# Patient Record
Sex: Male | Born: 1995 | Race: White | Hispanic: No | Marital: Single | Smoking: Never smoker
Health system: Southern US, Community
[De-identification: ages and names within clinical notes are randomized; demographics above are authoritative.]

## PROBLEM LIST (undated history)

## (undated) DIAGNOSIS — M419 Scoliosis, unspecified: Secondary | ICD-10-CM

## (undated) DIAGNOSIS — K900 Celiac disease: Secondary | ICD-10-CM

## (undated) HISTORY — DX: Celiac disease: K90.0

## (undated) HISTORY — DX: Scoliosis, unspecified: M41.9

---
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
## 2015-09-16 ENCOUNTER — Telehealth: Payer: Self-pay | Admitting: Medical

## 2015-09-16 NOTE — Telephone Encounter (Signed)
Pt's mother dropped off immun records and a form for completion. I am [place] to [place] to be [place] then to sh[place] for signature. Please abstract into system and national registry when complete. From needs to be mailed in supplied envelope and mother called stated its been done. Pt needs by 09/28/2015 for orientation.

## 2015-09-17 NOTE — Telephone Encounter (Signed)
Given to shane to sign then will be mailed by the end of the day. Have called pts mom to let her know.

## 2020-07-02 ENCOUNTER — Encounter (HOSPITAL_BASED_OUTPATIENT_CLINIC_OR_DEPARTMENT_OTHER): Payer: Self-pay

## 2020-07-02 ENCOUNTER — Emergency Department (HOSPITAL_BASED_OUTPATIENT_CLINIC_OR_DEPARTMENT_OTHER): Payer: BC Managed Care – PPO

## 2020-07-02 ENCOUNTER — Other Ambulatory Visit: Payer: Self-pay

## 2020-07-02 ENCOUNTER — Emergency Department (HOSPITAL_BASED_OUTPATIENT_CLINIC_OR_DEPARTMENT_OTHER)
Admission: EM | Admit: 2020-07-02 | Discharge: 2020-07-02 | Disposition: A | Discharge status: Home / Self Care | Payer: BC Managed Care – PPO | Attending: Emergency Medicine | Admitting: Emergency Medicine

## 2020-07-02 DIAGNOSIS — R569 Unspecified convulsions: Secondary | ICD-10-CM | POA: Diagnosis not present

## 2020-07-02 LAB — COMPREHENSIVE METABOLIC PANEL
ALT: 46 U/L — ABNORMAL HIGH (ref 0–44)
AST: 88 U/L — ABNORMAL HIGH (ref 15–41)
Albumin: 4.7 g/dL (ref 3.5–5.0)
Alkaline Phosphatase: 117 U/L (ref 38–126)
Anion gap: 13 (ref 5–15)
BUN: 10 mg/dL (ref 6–20)
CO2: 24 mmol/L (ref 22–32)
Calcium: 9.6 mg/dL (ref 8.9–10.3)
Chloride: 98 mmol/L (ref 98–111)
Creatinine, Ser: 1.06 mg/dL (ref 0.61–1.24)
GFR, Estimated: >60 mL/min (ref >60)
Glucose, Bld: 111 mg/dL — ABNORMAL HIGH (ref 70–99)
Potassium: 4.3 mmol/L (ref 3.5–5.1)
Sodium: 135 mmol/L (ref 135–145)
Total Bilirubin: 1.1 mg/dL (ref 0.3–1.2)
Total Protein: 8.6 g/dL — ABNORMAL HIGH (ref 6.5–8.1)

## 2020-07-02 LAB — CBC
HCT: 43.6 % (ref 39.0–52.0)
Hemoglobin: 15.2 g/dL (ref 13.0–17.0)
MCH: 33 pg (ref 26.0–34.0)
MCHC: 34.9 g/dL (ref 30.0–36.0)
MCV: 94.8 fL (ref 80.0–100.0)
Platelets: 261 10*3/uL (ref 150–400)
RBC: 4.6 MIL/uL (ref 4.22–5.81)
RDW: 12.8 % (ref 11.5–15.5)
WBC: 10 10*3/uL (ref 4.0–10.5)
nRBC: 0 % (ref 0.0–0.2)

## 2020-07-02 LAB — URINALYSIS, ROUTINE W REFLEX MICROSCOPIC
Bilirubin Urine: NEGATIVE
Glucose, UA: NEGATIVE mg/dL
Hgb urine dipstick: NEGATIVE
Ketones, ur: NEGATIVE mg/dL
Leukocytes,Ua: NEGATIVE
Nitrite: NEGATIVE
Protein, ur: NEGATIVE mg/dL
Specific Gravity, Urine: 1.01 (ref 1.005–1.030)
pH: 8 (ref 5.0–8.0)

## 2020-07-02 LAB — RAPID URINE DRUG SCREEN, HOSP PERFORMED
Amphetamines: NOT DETECTED
Barbiturates: NOT DETECTED
Benzodiazepines: NOT DETECTED
Cocaine: NOT DETECTED
Opiates: NOT DETECTED
Tetrahydrocannabinol: NOT DETECTED

## 2020-07-02 LAB — ETHANOL: Alcohol, Ethyl (B): <10 mg/dL (ref <10)

## 2020-07-02 NOTE — ED Provider Notes (Signed)
[facility] EMERGENCY DEPARTMENT Provider Note   CSN: [id number] Arrival date & time: 07/02/20  1132     History Chief Complaint  Patient presents with  . Seizures    Manuel Simmons is a 24 y.o. male.   Seizures Seizure activity on arrival: no   Seizure type:  Myoclonic and tonic Preceding symptoms: no sensation of an aura present, no headache, no hyperventilation, no nausea and no panic   Initial focality:  None Episode characteristics: abnormal movements, generalized shaking and stiffening   Episode characteristics: no eye deviation   Postictal symptoms: no confusion, no memory loss and no somnolence   Return to baseline: yes   Severity:  Moderate Timing:  Once Progression:  Resolved Recent head injury:  No recent head injuries PTA treatment:  None History of seizures: no        Past Medical History:  Diagnosis Date  . Celiac disease   . Scoliosis     Patient Active Problem List   Diagnosis Date Noted  . Knee pain, right anterior 05/22/2013    History reviewed. No pertinent surgical history.     Family History  Problem Relation Age of Onset  . Cancer Neg Hx   . Diabetes Neg Hx   . Heart disease Neg Hx   . Stroke Neg Hx   . Hypertension Neg Hx     Social History   Tobacco Use  . Smoking status: Never Smoker  . Smokeless tobacco: Never Used  Vaping Use  . Vaping Use: Never used  Substance Use Topics  . Alcohol use: Yes    Alcohol/week: 6.0 standard drinks    Types: 6 Cans of beer per week    Comment: daily  . Drug use: No    Home Medications Prior to Admission medications   Not on File    Allergies    Wheat bran  Review of Systems   Review of Systems  Constitutional: Negative for chills and fever.  HENT: Negative for congestion and rhinorrhea.   Respiratory: Negative for cough and shortness of breath.   Cardiovascular: Negative for chest pain and palpitations.  Gastrointestinal: Negative for diarrhea, nausea and  vomiting.  Genitourinary: Negative for difficulty urinating and dysuria.  Musculoskeletal: Negative for arthralgias and back pain.  Skin: Negative for color change and rash.  Neurological: Positive for seizures. Negative for light-headedness and headaches.    Physical Exam Updated Vital Signs BP 134/77 (BP Location: Right Arm)   Pulse 81   Temp 98.3 F (36.8 C) (Oral)   Resp 18   Ht 5\' 7"  (1.702 m)   Wt 64.4 kg   SpO2 100%   BMI 22.24 kg/m   Physical Exam Vitals and nursing note reviewed.  Constitutional:      General: He is not in acute distress.    Appearance: Normal appearance.  HENT:     Head: Normocephalic and atraumatic.     Nose: No rhinorrhea.  Eyes:     General:        Right eye: No discharge.        Left eye: No discharge.     Conjunctiva/sclera: Conjunctivae normal.  Cardiovascular:     Rate and Rhythm: Normal rate and regular rhythm.  Pulmonary:     Effort: Pulmonary effort is normal.     Breath sounds: No stridor.  Abdominal:     General: Abdomen is flat. There is no distension.     Palpations: Abdomen is soft.  Musculoskeletal:  General: No deformity or signs of injury.  Skin:    General: Skin is warm and dry.  Neurological:     General: No focal deficit present.     Mental Status: He is alert. Mental status is at baseline.     Motor: No weakness.     Comments: 5 out of 5 motor strength in all extremities, sensation intact throughout, no dysmetria, no dysdiadochokinesia, no ataxia with ambulation, cranial nerves II through XII intact, alert and oriented to person place and time   Psychiatric:        Mood and Affect: Mood normal.        Behavior: Behavior normal.        Thought Content: Thought content normal.     ED Results / Procedures / Treatments   Labs (all labs ordered are listed, but only abnormal results are displayed) Labs Reviewed  COMPREHENSIVE METABOLIC PANEL - Abnormal; Notable for the following components:      Result  Value   Glucose, Bld 111 (*)    Total Protein 8.6 (*)    AST 88 (*)    ALT 46 (*)    All other components within normal limits  RAPID URINE DRUG SCREEN, HOSP PERFORMED  ETHANOL  URINALYSIS, ROUTINE W REFLEX MICROSCOPIC  CBC    EKG None  Radiology CT Head Wo Contrast  Result Date: 07/02/2020 CLINICAL DATA:  Seizure, nontraumatic. Additional history provided: New seizure-like activity at job site. Patient denies head trauma, patient denies history of seizures. EXAM: CT HEAD WITHOUT CONTRAST TECHNIQUE: Contiguous axial images were obtained from the base of the skull through the vertex without intravenous contrast. COMPARISON:  No pertinent prior exams are available for comparison. FINDINGS: Brain: Cerebral volume is normal. There is no acute intracranial hemorrhage. No demarcated cortical infarct. No extra-axial fluid collection. No evidence of intracranial mass. No midline shift. Vascular: No hyperdense vessel. Skull: Normal. Negative for fracture or focal lesion. Sinuses/Orbits: Visualized orbits show no acute finding. No significant paranasal sinus disease or mastoid effusion at the imaged levels. IMPRESSION: No evidence of acute intracranial abnormality. Given the provided history of first-time seizure, consider brain MRI for further evaluation. Electronically Signed   By: Jackey Loge DO   On: 07/02/2020 12:29    Procedures Procedures (including critical care time)  Medications Ordered in ED Medications - No data to display  ED Course  I have reviewed the triage vital signs and the nursing notes.  Pertinent labs & imaging results that were available during my care of the patient were reviewed by me and considered in my medical decision making (see chart for details).    MDM Rules/Calculators/A&P                          New onset generalized tonic-clonic seizure lasted only a few minutes self resolved, patient cannot think of any instigating event, denies drug use, occasional  alcohol use reported.  Normal neurologic exam here.  No signs of trauma no recent history of trauma.  No recent illicit substance no recent change in medications, patient has no medical problems no family history.  CT scan labs reassess.  CT scan reviewed by radiology myself shows no significant abnormalities.  Laboratory studies are unremarkable for any derangement after my review.  No intoxication noted.  Patient is given return precautions instructions to follow-up with neurology.  Final Clinical Impression(s) / ED Diagnoses Final diagnoses:  Seizure (HCC)    Rx /  DC Orders ED Discharge Orders    None       Sabino Donovan, MD 07/02/20 1328

## 2020-07-02 NOTE — Discharge Instructions (Addendum)
Follow-up with a neurologist, you can call today and set up an appointment.  Return with worsening symptoms, avoid caffeine, avoid operating heavy machinery until cleared by the neurologist

## 2020-07-02 NOTE — ED Notes (Signed)
Taken to ct at this time. 

## 2020-07-02 NOTE — ED Triage Notes (Addendum)
Pt states he had a seizure at work ~8am-no hx of seizure-mother states she was called ~10am and went to get pt and bring him to ED-pt NAD/shaky/tremors-reports daily ETOH with last intake ~10pm yesterday-steady gait

## 2020-07-09 ENCOUNTER — Ambulatory Visit (INDEPENDENT_AMBULATORY_CARE_PROVIDER_SITE_OTHER): Payer: BC Managed Care – PPO | Admitting: Neurology

## 2020-07-09 ENCOUNTER — Encounter: Payer: Self-pay | Admitting: Neurology

## 2020-07-09 VITALS — BP 135/73 | HR 75 | Ht 67.0 in | Wt 146.0 lb

## 2020-07-09 DIAGNOSIS — R569 Unspecified convulsions: Secondary | ICD-10-CM

## 2020-07-09 NOTE — Progress Notes (Signed)
Provider:  Melvyn Novas, M D  Referring Provider: No ref. provider found Primary Care Physician:  Patient, No Pcp Per  Chief Complaint  Patient presents with  . New Patient (Initial Visit)    pt with dad (mom in waiting area), rm 10. presents today post ER visit. had a one time seizure like occurance while he was at work. states that he was working the next thing he knew he came to and was laying on floor. the co worker witnessed event and stated that he was foaming at the mouth. advised that it lasted around 30 seconds, his body was tensed up and he was unreponsive. patient has no recollection of the event.     HPI:  Manuel Simmons is a 24 y.o. male  and seen here upon  a referral from [facility]-  Mr. Doman reports that he apparently suffered a seizure in the morning hours of 07-02-20 the arrival time at [facility] was locked in at 1130.  Seizure may have occurred within an hour or 2 prior.  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  A coworker stated that he was out for about 30 seconds, foaming at the mouth, with not being responsive.  There is no more detailed report available apparently however there were no injuries related to that spell he did bite his tongue, but had no abrasions and no incontinence.  Coworkers later reported that he may have not felt well that morning and may have had a lot of coffee to drink however there was no history of fever febrile illness or vomiting.  There was never a seizure before.  The patient is not under the impression that he was sleep deprived.  He has to arise early as his work begins at 7:30 AM and his seizure would have taken place a couple of hours into work. The company's owner called Connor's parents and they picked him up as he had already regained consciousness and brought him to the med center.  EMS was not involved.  The patient has celiac disease.   Social history   Lives alone, no pets, but is close with parents, works in [employer] representative, On smoker, beer drinker, caffeine _ 4 cups I AM, no sodas no tea.  Sports : none .   Review of Systems: Out of a complete 14 system review, the patient complains of only the following symptoms, and all other reviewed systems are negative.  No medication or drug use. No learning disabiities, no ADD/ ADHD.   Social History   Socioeconomic History  . Marital status: Single    Spouse name: Not on file  . Number of children: Not on file  . Years of education: Not on file  . Highest education level: Not on file  Occupational History  . Not on file  Tobacco Use  . Smoking status: Never Smoker  . Smokeless tobacco: Never Used  Vaping Use  . Vaping Use: Never used  Substance and Sexual Activity  . Alcohol use: Yes    Alcohol/week: 6.0 standard drinks    Types: 6 Cans of beer per week    Comment: daily  . Drug use: No  . Sexual activity: Not on file  Other Topics Concern  . Not on file  Social History Narrative   Senior in high school at [employer], plays tennis and cross-country.  Planning to attend [employer].  Exercises, eats healthy .  Father is an Airline pilot, mother is a Futures trader.  He has 2 brothers.  AB grades   Social Determinants of Health   Financial Resource Strain:   . Difficulty of Paying Living Expenses: Not on file  Food Insecurity:   . Worried About Programme researcher, broadcasting/film/video in the Last Year: Not on file  . Ran Out of Food in the Last Year: Not on file  Transportation Needs:   . Lack of Transportation (Medical): Not on file  . Lack of Transportation (Non-Medical): Not on file  Physical Activity:   . Days of Exercise per Week: Not on file  . Minutes of Exercise per Session: Not on file  Stress:   . Feeling of Stress : Not on file  Social Connections:   . Frequency of Communication with Friends and Family: Not on file  . Frequency of Social Gatherings with Friends and Family: Not on file  .  Attends Religious Services: Not on file  . Active Member of Clubs or Organizations: Not on file  . Attends Banker Meetings: Not on file  . Marital Status: Not on file  Intimate Partner Violence:   . Fear of Current or Ex-Partner: Not on file  . Emotionally Abused: Not on file  . Physically Abused: Not on file  . Sexually Abused: Not on file    Family History  Problem Relation Age of Onset  . Cancer Neg Hx   . Diabetes Neg Hx   . Heart disease Neg Hx   . Stroke Neg Hx   . Hypertension Neg Hx     Past Medical History:  Diagnosis Date  . Celiac disease   . Scoliosis     No past surgical history on file.  Current Outpatient Medications  Medication Sig Dispense Refill  . hydrOXYzine (ATARAX/VISTARIL) 25 MG tablet Take 25 mg by mouth 4 (four) times daily.    . predniSONE (DELTASONE) 20 MG tablet Take by mouth.     No current facility-administered medications for this visit.    Allergies as of 07/09/2020 - Review Complete 07/09/2020  Allergen Reaction Noted  . Wheat bran  10/10/2013    Vitals: BP 135/73   Pulse 75   Ht 5\' 7"  (1.702 m)   Wt 146 lb (66.2 kg)   BMI 22.87 kg/m  Last Weight:  Wt Readings from Last 1 Encounters:  07/09/20 146 lb (66.2 kg)   Last Height:   Ht Readings from Last 1 Encounters:  07/09/20 5\' 7"  (1.702 m)    Physical exam:  General: The patient is awake, alert and appears not in acute distress. The patient is well groomed. Head: Normocephalic, atraumatic. Neck is supple.  Mallampati 1, neck circumference: 14 Cardiovascular:  Regular rate and rhythm, without  murmurs or carotid bruit, and without distended neck veins. Respiratory: Lungs are clear to auscultation. Skin:  Without evidence of edema, or rash Trunk: normal posture.  Neurologic exam : The patient is awake and alert, oriented to place and time.  Memory subjective  described as intact. There is a normal attention span & concentration ability. Speech is fluent  without  dysarthria, dysphonia or aphasia. Mood and affect are appropriate.  Cranial nerves: Pupils are equal and briskly reactive to light. Funduscopic exam without evidence of pallor or edema. Extraocular movements  in vertical and horizontal planes intact and without nystagmus.  Visual fields by finger perimetry are intact. Hearing to finger rub intact.  Facial sensation intact to fine touch. Facial motor strength  is symmetric and tongue and uvula move midline. Tongue protrusion into either cheek is normal. Shoulder shrug is normal.   Motor exam: Normal tone ,muscle bulk and symmetric  strength in all extremities.  Sensory:  Fine touch, pinprick and vibration were tested in all extremities. Proprioception was normal.  Coordination: Rapid alternating movements in the fingers/hands were normal. Finger-to-nose maneuver  normal without evidence of ataxia, dysmetria or tremor.  Gait and station: Patient walks without assistive device and is able unassisted to climb up to the exam table. Strength within normal limits. Stance is stable and normal. Tandem gait is unfragmented. Romberg testing deferred.  Deep tendon reflexes: in the  upper and lower extremities are symmetric and intact. BRISK.    The patient's laboratory testing for a rapid urine drug screen was negative, ethanol was negative, urine analysis was negative, CBC was negative, comprehensive metabolic panel however revealed elevated total protein elevated AST and elevated ALT.  Normal CT Brain.    Assessment:  After physical and neurologic examination, review of laboratory studies, imaging, neurophysiology testing and pre-existing records, assessment is that of :   Heavy beer drinking - will need to reduce.   Plan:  Treatment plan and additional workup :   Single seizure- no meds, driving restriction for 6 month, working with machinery limited.   patient and father understood the instructions of not driving until April 3818. No  unattended swimming, lawn mowing. avoiding hight/ climbs.   EEG ordered.         Porfirio Mylar Vernell Back MD 07/09/2020

## 2020-07-09 NOTE — Patient Instructions (Signed)
Seizure, Adult °A seizure is a sudden burst of abnormal electrical activity in the brain. Seizures usually last from 30 seconds to 2 minutes. They can cause many different symptoms. °Usually, seizures are not harmful unless they last a long time. °What are the causes? °Common causes of this condition include: °· Fever or infection. °· Conditions that affect the brain, such as: °? A brain abnormality that you were born with. °? A brain or head injury. °? Bleeding in the brain. °? A tumor. °? Stroke. °? Brain disorders such as autism or cerebral palsy. °· Low blood sugar. °· Conditions that are passed from parent to child (are inherited). °· Problems with substances, such as: °? Having a reaction to a drug or a medicine. °? Suddenly stopping the use of a substance (withdrawal). °In some cases, the cause may not be known. A person who has repeated seizures over time without a clear cause has a condition called epilepsy. °What increases the risk? °You are more likely to get this condition if you have: °· A family history of epilepsy. °· Had a seizure in the past. °· A brain disorder. °· A history of head injury, lack of oxygen at birth, or strokes. °What are the signs or symptoms? °There are many types of seizures. The symptoms vary depending on the type of seizure you have. Examples of symptoms during a seizure include: °· Shaking (convulsions). °· Stiffness in the body. °· Passing out (losing consciousness). °· Head nodding. °· Staring. °· Not responding to sound or touch. °· Loss of bladder control and bowel control. °Some people have symptoms right before and right after a seizure happens. °Symptoms before a seizure may include: °· Fear. °· Worry (anxiety). °· Feeling like you may vomit (nauseous). °· Feeling like the room is spinning (vertigo). °· Feeling like you saw or heard something before (déjà vu). °· Odd tastes or smells. °· Changes in how you see. You may see flashing lights or spots. °Symptoms after a  seizure happens can include: °· Confusion. °· Sleepiness. °· Headache. °· Weakness on one side of the body. °How is this treated? °Most seizures will stop on their own in under 5 minutes. In these cases, no treatment is needed. Seizures that last longer than 5 minutes will usually need treatment. Treatment can include: °· Medicines given through an IV tube. °· Avoiding things that are known to cause your seizures. These can include medicines that you take for another condition. °· Medicines to treat epilepsy. °· Surgery to stop the seizures. This may be needed if medicines do not help. °Follow these instructions at home: °Medicines °· Take over-the-counter and prescription medicines only as told by your doctor. °· Do not eat or drink anything that may keep your medicine from working, such as alcohol. °Activity °· Do not do any activities that would be dangerous if you had another seizure, like driving or swimming. Wait until your doctor says it is safe for you to do them. °· If you live in the [place] ask your local DMV (department of motor vehicles) when you can drive. °· Get plenty of rest. °Teaching others °Teach friends and family what to do when you have a seizure. They should: °· Lay you on the ground. °· Protect your head and body. °· Loosen any tight clothing around your neck. °· Turn you on your side. °· Not hold you down. °· Not put anything into your mouth. °· Know whether or not you need emergency care. °· Stay   with you until you are better. ° °General instructions °· Contact your doctor each time you have a seizure. °· Avoid anything that gives you seizures. °· Keep a seizure diary. Write down: °? What you think caused each seizure. °? What you remember about each seizure. °· Keep all follow-up visits as told by your doctor. This is important. °Contact a doctor if: °· You have another seizure. °· You have seizures more often. °· There is any change in what happens during your seizures. °· You keep having  seizures with treatment. °· You have symptoms of being sick or having an infection. °Get help right away if: °· You have a seizure that: °? Lasts longer than 5 minutes. °? Is different than seizures you had before. °? Makes it harder to breathe. °? Happens after you hurt your head. °· You have any of these symptoms after a seizure: °? Not being able to speak. °? Not being able to use a part of your body. °? Confusion. °? A bad headache. °· You have two or more seizures in a row. °· You do not wake up right after a seizure. °· You get hurt during a seizure. °These symptoms may be an emergency. Do not wait to see if the symptoms will go away. Get medical help right away. Call your local emergency services (911 in the [place] Do not drive yourself to the hospital. °Summary °· Seizures usually last from 30 seconds to 2 minutes. Usually, they are not harmful unless they last a long time. °· Do not eat or drink anything that may keep your medicine from working, such as alcohol. °· Teach friends and family what to do when you have a seizure. °· Contact your doctor each time you have a seizure. °This information is not intended to replace advice given to you by your health care provider. Make sure you discuss any questions you have with your health care provider. °Document Revised: 11/17/2018 Document Reviewed: 11/17/2018 °Elsevier Patient Education © 2020 [employer] ° °

## 2020-07-16 ENCOUNTER — Other Ambulatory Visit: Payer: BC Managed Care – PPO

## 2020-09-26 DIAGNOSIS — R69 Illness, unspecified: Secondary | ICD-10-CM | POA: Diagnosis not present

## 2020-10-02 DIAGNOSIS — R69 Illness, unspecified: Secondary | ICD-10-CM | POA: Diagnosis not present

## 2020-10-03 DIAGNOSIS — R69 Illness, unspecified: Secondary | ICD-10-CM | POA: Diagnosis not present

## 2020-10-04 DIAGNOSIS — R69 Illness, unspecified: Secondary | ICD-10-CM | POA: Diagnosis not present

## 2020-10-05 DIAGNOSIS — R69 Illness, unspecified: Secondary | ICD-10-CM | POA: Diagnosis not present

## 2020-10-06 DIAGNOSIS — R69 Illness, unspecified: Secondary | ICD-10-CM | POA: Diagnosis not present

## 2020-10-07 DIAGNOSIS — R69 Illness, unspecified: Secondary | ICD-10-CM | POA: Diagnosis not present

## 2020-10-08 DIAGNOSIS — R69 Illness, unspecified: Secondary | ICD-10-CM | POA: Diagnosis not present

## 2020-10-09 DIAGNOSIS — R69 Illness, unspecified: Secondary | ICD-10-CM | POA: Diagnosis not present

## 2020-10-10 DIAGNOSIS — R69 Illness, unspecified: Secondary | ICD-10-CM | POA: Diagnosis not present

## 2020-10-11 DIAGNOSIS — R69 Illness, unspecified: Secondary | ICD-10-CM | POA: Diagnosis not present

## 2020-10-12 DIAGNOSIS — R69 Illness, unspecified: Secondary | ICD-10-CM | POA: Diagnosis not present

## 2020-10-13 DIAGNOSIS — R69 Illness, unspecified: Secondary | ICD-10-CM | POA: Diagnosis not present

## 2020-10-14 DIAGNOSIS — R69 Illness, unspecified: Secondary | ICD-10-CM | POA: Diagnosis not present

## 2020-10-15 DIAGNOSIS — R69 Illness, unspecified: Secondary | ICD-10-CM | POA: Diagnosis not present

## 2020-10-16 DIAGNOSIS — R69 Illness, unspecified: Secondary | ICD-10-CM | POA: Diagnosis not present

## 2020-10-17 DIAGNOSIS — R69 Illness, unspecified: Secondary | ICD-10-CM | POA: Diagnosis not present

## 2020-10-18 DIAGNOSIS — R69 Illness, unspecified: Secondary | ICD-10-CM | POA: Diagnosis not present

## 2020-10-19 DIAGNOSIS — R69 Illness, unspecified: Secondary | ICD-10-CM | POA: Diagnosis not present

## 2020-10-20 DIAGNOSIS — R69 Illness, unspecified: Secondary | ICD-10-CM | POA: Diagnosis not present

## 2020-10-21 DIAGNOSIS — R69 Illness, unspecified: Secondary | ICD-10-CM | POA: Diagnosis not present

## 2020-10-22 DIAGNOSIS — R69 Illness, unspecified: Secondary | ICD-10-CM | POA: Diagnosis not present

## 2020-10-23 DIAGNOSIS — R69 Illness, unspecified: Secondary | ICD-10-CM | POA: Diagnosis not present

## 2021-01-12 ENCOUNTER — Encounter: Payer: Self-pay | Admitting: Adult Health

## 2021-01-12 ENCOUNTER — Ambulatory Visit: Payer: BC Managed Care – PPO | Admitting: Adult Health

## 2021-06-02 IMAGING — CT CT HEAD W/O CM
3 series · 15 of 47 positions shown, 18 images · non-contrast
Comparison: No pertinent prior exams are available for comparison.

CLINICAL DATA: Seizure, nontraumatic. Additional history provided:
patient denies history of seizures.

EXAM:
CT HEAD WITHOUT CONTRAST
TECHNIQUE: Contiguous axial images were obtained from the base of the skull
through the vertex without intravenous contrast.

[Series 2: head wo · axial · 0.42mm/px · z∈[-181,-51]mm · 9 of 32 slices shown, 12 images]
[im 3/32  brain]
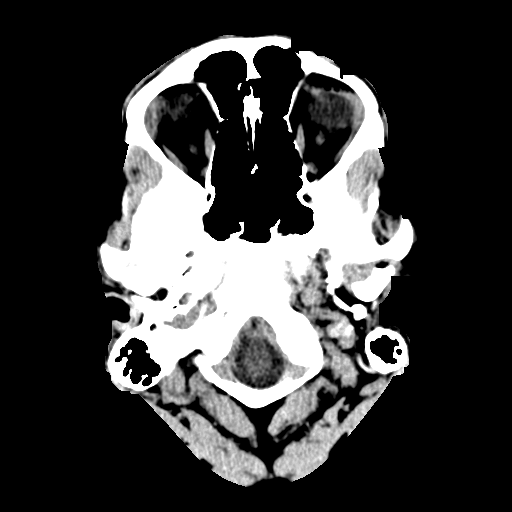
[im 3/32  bone]
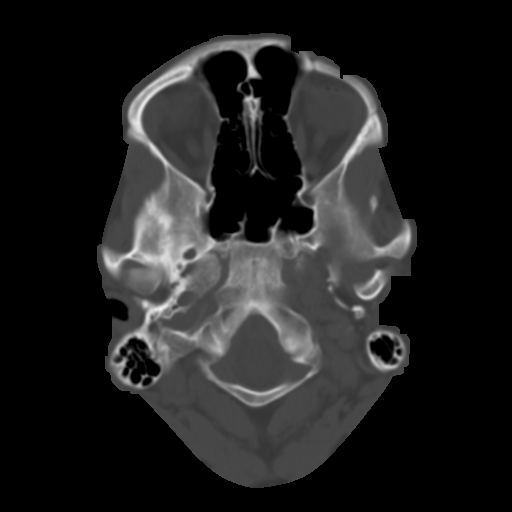
[im 6/32  brain]
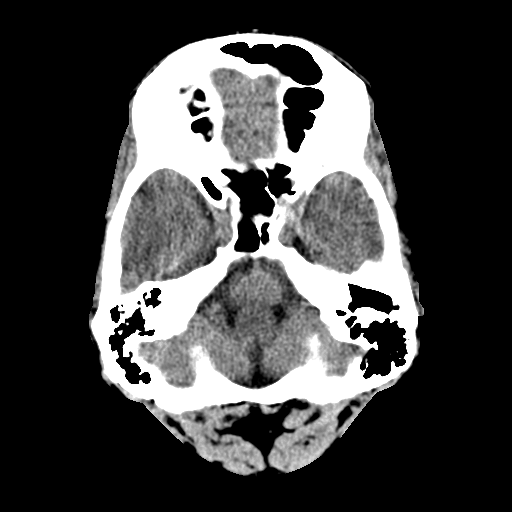
[im 9/32  brain]
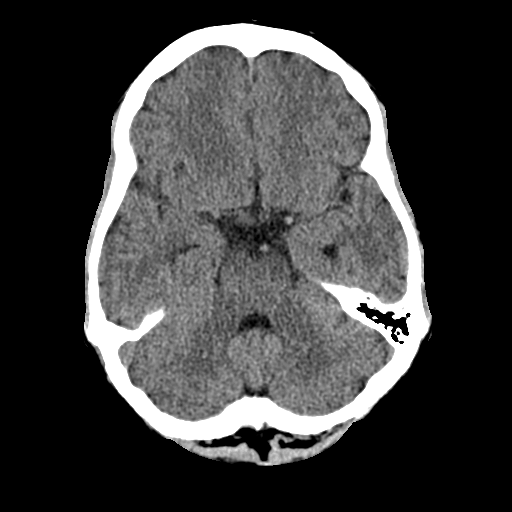
[im 12/32  brain]
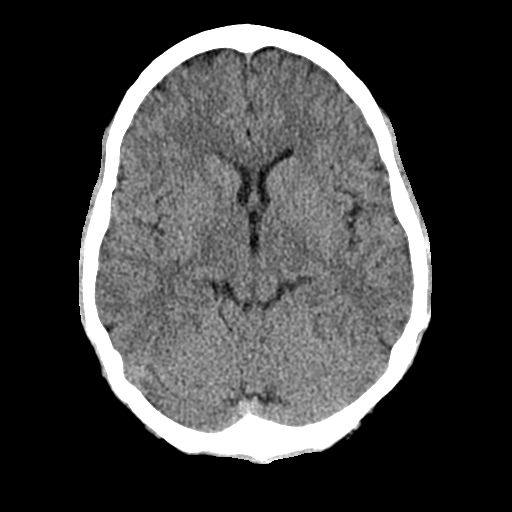
[im 17/32  brain]
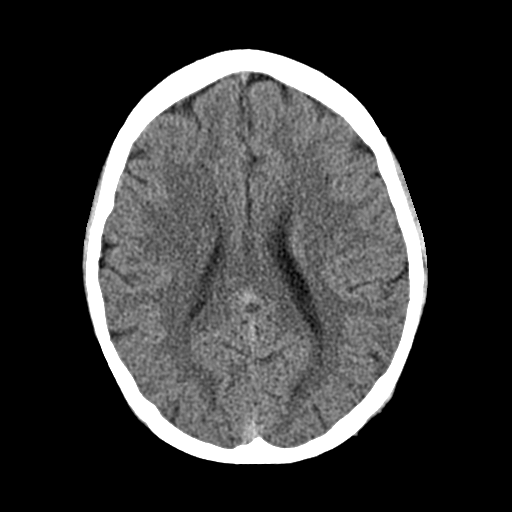
[im 17/32  bone]
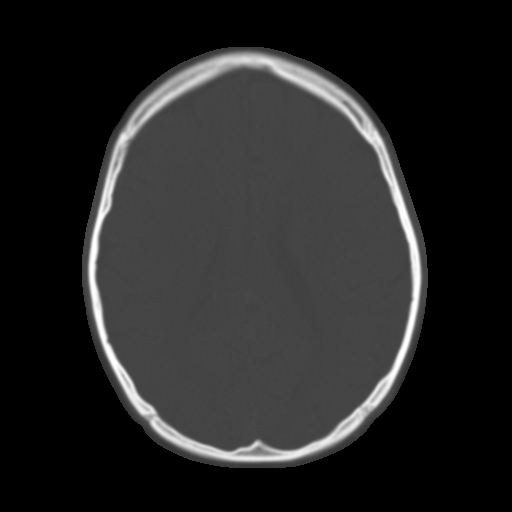
[im 20/32  brain]
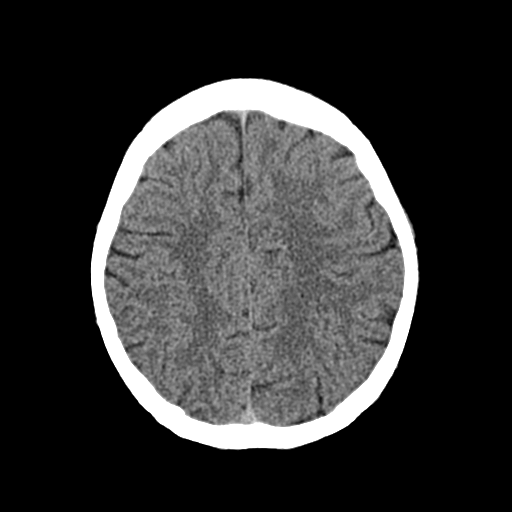
[im 23/32  brain]
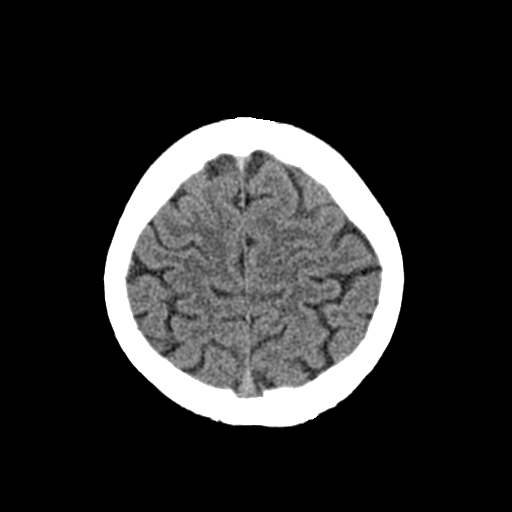
[im 26/32  brain]
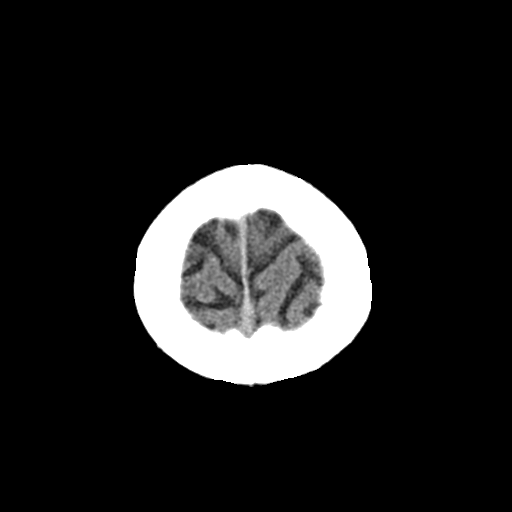
[im 29/32  brain]
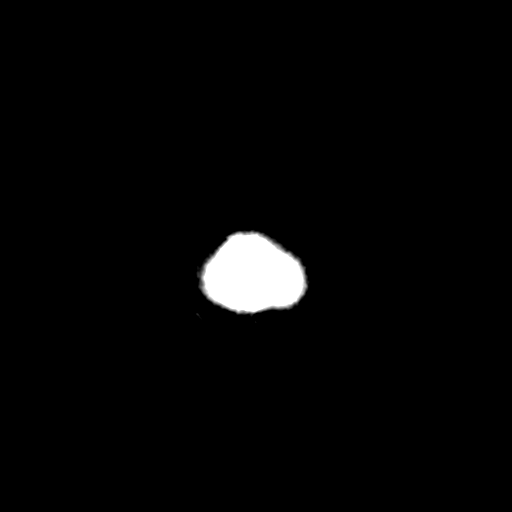
[im 29/32  bone]
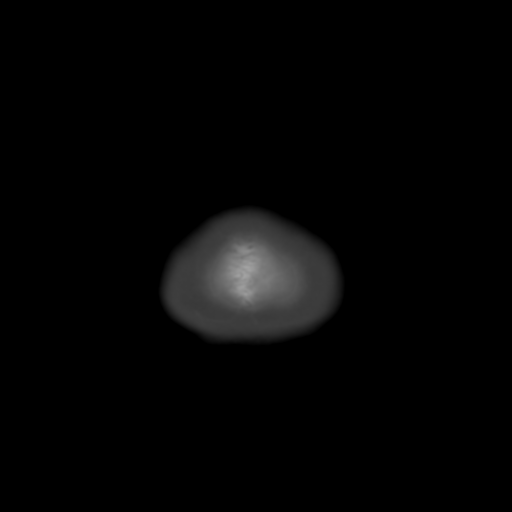

[Series 4: cor soft · coronal · 0.30mm/px · 3 of 67 slices shown]
[im 23/67  brain]
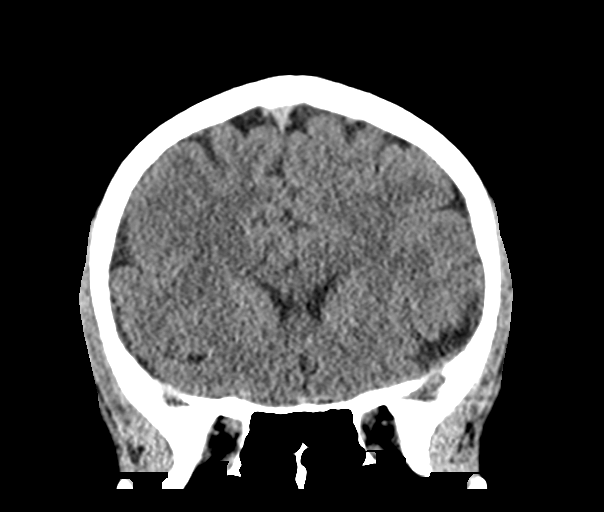
[im 30/67  brain]
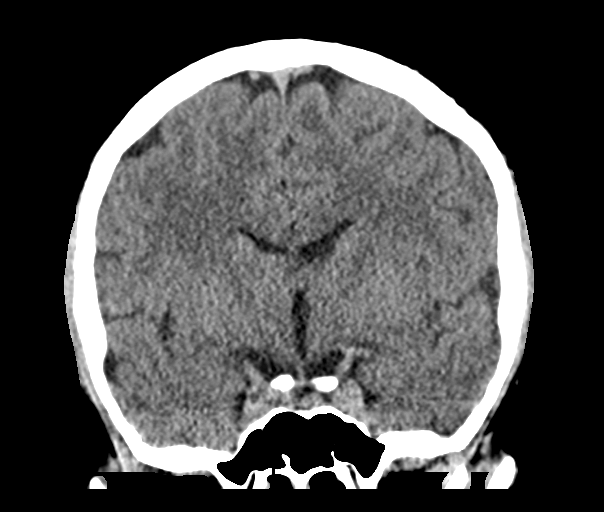
[im 37/67  brain]
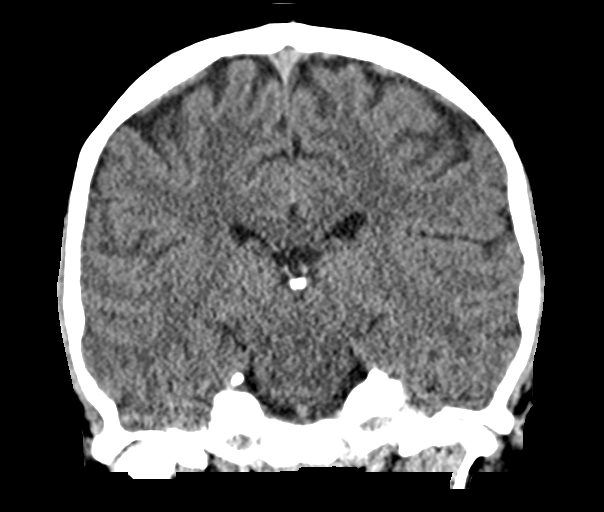

[Series 5: sag soft · sagittal · 0.32mm/px · 3 of 54 slices shown]
[im 18/54  brain]
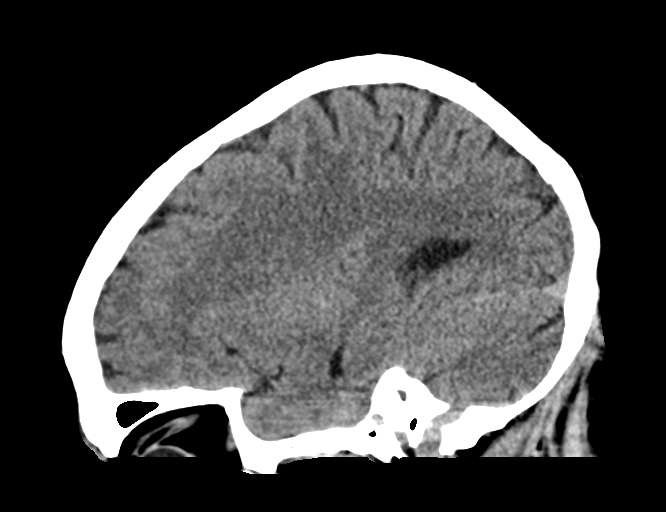
[im 27/54  brain]
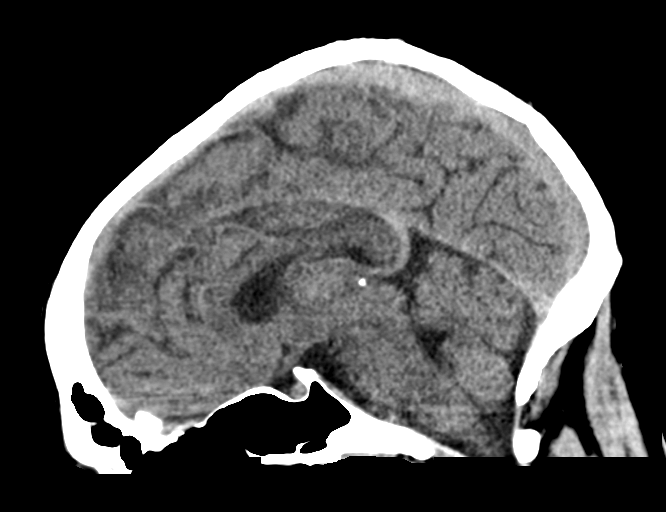
[im 36/54  brain]
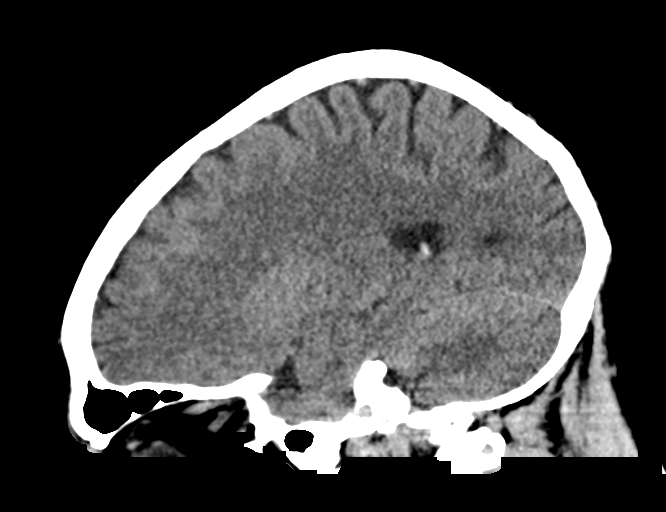

[15 of 47 positions shown; findings below may reference images not displayed]

FINDINGS: Brain:

Cerebral volume is normal.

There is no acute intracranial hemorrhage.

No demarcated cortical infarct.

No extra-axial fluid collection.

No evidence of intracranial mass.

No midline shift.

Vascular: No hyperdense vessel.

Skull: Normal. Negative for fracture or focal lesion.

Sinuses/Orbits: Visualized orbits show no acute finding. No
significant paranasal sinus disease or mastoid effusion at the
imaged levels.
IMPRESSION: No evidence of acute intracranial abnormality.

Given the provided history of first-time seizure, consider brain MRI
for further evaluation.

## 2022-09-13 ENCOUNTER — Emergency Department (HOSPITAL_BASED_OUTPATIENT_CLINIC_OR_DEPARTMENT_OTHER)
Admission: EM | Admit: 2022-09-13 | Discharge: 2022-09-13 | Disposition: A | Discharge status: Home / Self Care | Payer: Self-pay | Attending: Emergency Medicine | Admitting: Emergency Medicine

## 2022-09-13 ENCOUNTER — Encounter (HOSPITAL_BASED_OUTPATIENT_CLINIC_OR_DEPARTMENT_OTHER): Payer: Self-pay | Admitting: *Deleted

## 2022-09-13 ENCOUNTER — Other Ambulatory Visit: Payer: Self-pay

## 2022-09-13 DIAGNOSIS — S76311A Strain of muscle, fascia and tendon of the posterior muscle group at thigh level, right thigh, initial encounter: Secondary | ICD-10-CM

## 2022-09-13 DIAGNOSIS — X509XXA Other and unspecified overexertion or strenuous movements or postures, initial encounter: Secondary | ICD-10-CM | POA: Insufficient documentation

## 2022-09-13 DIAGNOSIS — Y9302 Activity, running: Secondary | ICD-10-CM | POA: Insufficient documentation

## 2022-09-13 MED ORDER — MELOXICAM 15 MG PO TABS: 15.0000 mg | ORAL_TABLET | Freq: Every day | ORAL | 0 refills | Status: AC

## 2022-09-13 NOTE — ED Provider Notes (Signed)
[facility] EMERGENCY DEPT Provider Note   CSN: [id number] Arrival date & time: 09/13/22  1454     History  Chief Complaint  Patient presents with   Leg Pain    Manuel Simmons is a 27 y.o. male.  Patient presents to the emergency department complaining of right-sided posterior thigh pain.  The patient states he was running and felt a tearing sensation in the posterior thigh and is concerned he may have torn hamstring.  The patient is ambulatory upon arrival.  The patient has no relevant past medical history on file HPI     Home Medications Prior to Admission medications   Medication Sig Start Date End Date Taking? Authorizing Provider  meloxicam (MOBIC) 15 MG tablet Take 1 tablet (15 mg total) by mouth daily. 09/13/22 10/13/22 Yes Dorothyann Peng, PA-C      Allergies    Patient has no known allergies.    Review of Systems   Review of Systems  Musculoskeletal:  Positive for myalgias.    Physical Exam Updated Vital Signs BP (!) 164/100 (BP Location: Right Arm)   Pulse (!) 124   Temp 97.9 F (36.6 C)   Resp 18   SpO2 95%  Physical Exam HENT:     Head: Normocephalic and atraumatic.  Eyes:     Pupils: Pupils are equal, round, and reactive to light.  Pulmonary:     Effort: Pulmonary effort is normal. No respiratory distress.  Musculoskeletal:        General: No signs of injury. Normal range of motion.     Cervical back: Normal range of motion.     Comments: Patient with normal range of motion of the right knee and hip.  Pain with right-sided knee flexion.  Skin:    General: Skin is dry.     Findings: No bruising.     Comments: No bruising noted to posterior right thigh.  Neurological:     Mental Status: He is alert.  Psychiatric:        Speech: Speech normal.        Behavior: Behavior normal.     ED Results / Procedures / Treatments   Labs (all labs ordered are listed, but only abnormal results are displayed) Labs Reviewed - No data to  display  EKG None  Radiology No results found.  Procedures Procedures    Medications Ordered in ED Medications - No data to display  ED Course/ Medical Decision Making/ A&P                           Medical Decision Making  Patient presents with a chief complaint of right-sided leg pain.  Differential diagnosis includes but is not limited to fracture, dislocation, soft tissue injury, and others  There is no relevant past medical history on file  There is no indication at this time for imaging or laboratory work.  No trauma noted to the area.  The mechanism of injury does not support a fracture at this time.  The patient is ambulatory.  I see no benefit of plain films of the right leg  The patient's injuries are consistent with a hamstring strain.  No large hematomas or bruising to suggest a severe high-grade strain at this time.  Plan to discharge patient home with Ace wrap for compression, instructions on icing and heat therapy, and meloxicam prescription.  Patient to follow-up as needed with orthopedics.  Final Clinical Impression(s) / ED Diagnoses Final diagnoses:  Strain of right hamstring muscle, initial encounter    Rx / DC Orders ED Discharge Orders          Ordered    meloxicam (MOBIC) 15 MG tablet  Daily        09/13/22 1727              Ronny Bacon 09/13/22 1728    Tretha Sciara, MD 09/14/22 [phone]

## 2022-09-13 NOTE — Discharge Instructions (Addendum)
You were seen today for right leg pain.  Your examination is consistent with a hamstring strain.  Be sure to rest the right lower extremity. I recommend keeping the hamstring wrapped with Ace wrap's.  Today and tomorrow I recommend icing the affected area.  After that you may switch to mild heat.  I have prescribed an anti-inflammatory medication, meloxicam.  Please use this once a day.  You may not take any other NSAID medications while taking this medication.  You may take extra strength Tylenol as needed.  I have attached contact information for orthopedics for follow-up as needed.

## 2022-09-13 NOTE — ED Notes (Signed)
Pt hamstring wrapped with ace wrap per [place], PA.

## 2022-09-13 NOTE — ED Triage Notes (Signed)
Pt is ambulatory to triage.  He is here for evaluation of pulled right hamstring which occurred while running today.

## 2022-09-14 ENCOUNTER — Encounter: Payer: Self-pay | Admitting: Neurology
